# Patient Record
Sex: Male | Born: 1960 | Hispanic: No | Marital: Married | Smoking: Current every day smoker
Health system: Southern US, Community
[De-identification: ages and names within clinical notes are randomized; demographics above are authoritative.]

## PROBLEM LIST (undated history)

## (undated) DIAGNOSIS — Q279 Congenital malformation of peripheral vascular system, unspecified: Secondary | ICD-10-CM

## (undated) DIAGNOSIS — D519 Vitamin B12 deficiency anemia, unspecified: Secondary | ICD-10-CM

## (undated) DIAGNOSIS — E785 Hyperlipidemia, unspecified: Secondary | ICD-10-CM

## (undated) DIAGNOSIS — G4733 Obstructive sleep apnea (adult) (pediatric): Secondary | ICD-10-CM

## (undated) DIAGNOSIS — M47817 Spondylosis without myelopathy or radiculopathy, lumbosacral region: Secondary | ICD-10-CM

## (undated) DIAGNOSIS — M797 Fibromyalgia: Secondary | ICD-10-CM

## (undated) DIAGNOSIS — K649 Unspecified hemorrhoids: Secondary | ICD-10-CM

## (undated) DIAGNOSIS — K219 Gastro-esophageal reflux disease without esophagitis: Secondary | ICD-10-CM

## (undated) DIAGNOSIS — D369 Benign neoplasm, unspecified site: Secondary | ICD-10-CM

## (undated) HISTORY — DX: Congenital malformation of peripheral vascular system, unspecified: Q27.9

## (undated) HISTORY — PX: KNEE SURGERY: SHX244

## (undated) HISTORY — DX: Fibromyalgia: M79.7

## (undated) HISTORY — DX: Unspecified hemorrhoids: K64.9

## (undated) HISTORY — DX: Gastro-esophageal reflux disease without esophagitis: K21.9

## (undated) HISTORY — DX: Spondylosis without myelopathy or radiculopathy, lumbosacral region: M47.817

## (undated) HISTORY — DX: Vitamin B12 deficiency anemia, unspecified: D51.9

## (undated) HISTORY — DX: Obstructive sleep apnea (adult) (pediatric): G47.33

## (undated) HISTORY — DX: Benign neoplasm, unspecified site: D36.9

## (undated) HISTORY — DX: Hyperlipidemia, unspecified: E78.5

---

## 2018-12-14 HISTORY — PX: COLONOSCOPY: SHX174

## 2020-12-12 ENCOUNTER — Telehealth: Payer: Self-pay

## 2020-12-12 NOTE — Telephone Encounter (Signed)
Left message to call back.  Received records (colon report) from [facility] permanent. Need to know if pt wants to transfer care. Missing path results.

## 2020-12-19 NOTE — Telephone Encounter (Signed)
Spoke with patient. Is wanting to transfer care. Will get path results sent as well. Any provider is fine.

## 2021-01-11 NOTE — Telephone Encounter (Signed)
Patient brought in additional records from previous GI. Records given to [facility] to give to schedulers.

## 2021-01-18 ENCOUNTER — Encounter: Payer: Self-pay | Admitting: Gastroenterology

## 2021-02-08 ENCOUNTER — Other Ambulatory Visit (INDEPENDENT_AMBULATORY_CARE_PROVIDER_SITE_OTHER): Payer: 59

## 2021-02-08 ENCOUNTER — Encounter: Payer: Self-pay | Admitting: Gastroenterology

## 2021-02-08 ENCOUNTER — Ambulatory Visit (INDEPENDENT_AMBULATORY_CARE_PROVIDER_SITE_OTHER): Payer: 59 | Admitting: Gastroenterology

## 2021-02-08 VITALS — BP 122/68 | HR 76 | Ht 74.0 in | Wt 203.0 lb

## 2021-02-08 DIAGNOSIS — K649 Unspecified hemorrhoids: Secondary | ICD-10-CM | POA: Diagnosis not present

## 2021-02-08 DIAGNOSIS — K640 First degree hemorrhoids: Secondary | ICD-10-CM | POA: Diagnosis not present

## 2021-02-08 DIAGNOSIS — Z860100 Personal history of colon polyps, unspecified: Secondary | ICD-10-CM

## 2021-02-08 LAB — COMPREHENSIVE METABOLIC PANEL
ALT: 13 U/L (ref 0–53)
AST: 13 U/L (ref 0–37)
Albumin: 3.9 g/dL (ref 3.5–5.2)
Alkaline Phosphatase: 62 U/L (ref 39–117)
BUN: 13 mg/dL (ref 6–23)
CO2: 27 mEq/L (ref 19–32)
Calcium: 8.8 mg/dL (ref 8.4–10.5)
Chloride: 102 mEq/L (ref 96–112)
Creatinine, Ser: 1 mg/dL (ref 0.40–1.50)
GFR: 82.28 mL/min (ref >60.00)
Glucose, Bld: 92 mg/dL (ref 70–99)
Potassium: 4.2 mEq/L (ref 3.5–5.1)
Sodium: 136 mEq/L (ref 135–145)
Total Bilirubin: 0.5 mg/dL (ref 0.2–1.2)
Total Protein: 7.1 g/dL (ref 6.0–8.3)

## 2021-02-08 LAB — CBC WITH DIFFERENTIAL/PLATELET
Basophils Absolute: 0 10*3/uL (ref 0.0–0.1)
Basophils Relative: 0.2 % (ref 0.0–3.0)
Eosinophils Absolute: 0.3 10*3/uL (ref 0.0–0.7)
Eosinophils Relative: 4.6 % (ref 0.0–5.0)
HCT: 44.2 % (ref 39.0–52.0)
Hemoglobin: 15.2 g/dL (ref 13.0–17.0)
Lymphocytes Relative: 25.3 % (ref 12.0–46.0)
Lymphs Abs: 1.8 10*3/uL (ref 0.7–4.0)
MCHC: 34.4 g/dL (ref 30.0–36.0)
MCV: 98.3 fl (ref 78.0–100.0)
Monocytes Absolute: 0.5 10*3/uL (ref 0.1–1.0)
Monocytes Relative: 7.6 % (ref 3.0–12.0)
Neutro Abs: 4.4 10*3/uL (ref 1.4–7.7)
Neutrophils Relative %: 62.3 % (ref 43.0–77.0)
Platelets: 139 10*3/uL — ABNORMAL LOW (ref 150.0–400.0)
RBC: 4.49 Mil/uL (ref 4.22–5.81)
RDW: 12.7 % (ref 11.5–15.5)
WBC: 7 10*3/uL (ref 4.0–10.5)

## 2021-02-08 NOTE — Progress Notes (Signed)
HPI : Daryl Durham is a very pleasant 60 year old male originally from [place] who recently moved here from [place].  He has a history of colon polyps he has been having intermittent bright red blood per rectum.  He has some of the records from his previous GI evaluation which indicate he had a colonoscopy in July 2020 with 6 small tubular adenomas were found and removed and he was recommended to repeat colonoscopy in 3 years.  These notes also reference a colonoscopy in 2019 in which 2 polyps were removed including one that was piecemeal.  Further details of this polyp were not available including size and histology.  The colonoscopy from 2020 does not make mention of a tattoo or a polypectomy site. The patient states that he has been having bright red blood per rectum intermittently for many years.  He states that he will see blood for a few days in a row, then we will not see any blood for weeks or sometimes months.  He describes the blood as bright red and noted on the toilet paper and also in the toilet water, sometimes turning the water red.  When asked, he does admit that he is more likely to see blood when he has a hard stool or is straining with bowel movements.  He denies pain with the passage of stool.  He denies other GI symptoms such as abdominal pain, diarrhea, nausea or vomiting.  His weight has been stable. The patient asked if we can do lab test to check his kidney and liver function.   Past Medical History:  Diagnosis Date   Fibromyalgia    GERD (gastroesophageal reflux disease)    Hemorrhoids    Hyperlipidemia    Lumbosacral spondylosis    OSA (obstructive sleep apnea)    not on CPAP   Tubular adenoma    Venous malformation    Vitamin B12 deficiency anemia      Past Surgical History:  Procedure Laterality Date   COLONOSCOPY  12/14/2018   KNEE SURGERY     Family History  Problem Relation Age of Onset   Diabetes Mother    Social History   Tobacco Use    Smoking status: Every Day    Types: Cigarettes   Smokeless tobacco: Never  Vaping Use   Vaping Use: Never used  Substance Use Topics   Alcohol use: Yes   Drug use: Never  Patient states he will have 3 beers or glasses of wine every 2 weeks.  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   No current outpatient medications on file.   No current facility-administered medications for this visit.   No Known Allergies   Review of Systems: All systems reviewed and negative except where noted in HPI.    No results found.  Physical Exam: BP 122/68   Pulse 76   Ht '6\' 2"'$  (1.88 m)   Wt 203 lb (92.1 kg)   BMI 26.06 kg/m  Constitutional: Pleasant,well-developed, African male in no acute distress. HEENT: Normocephalic and atraumatic. Conjunctivae are normal. No scleral icterus.  Mallampati 2 Cardiovascular: Normal rate, regular rhythm.  Pulmonary/chest: Effort normal and breath sounds normal. No wheezing, rales or rhonchi. Abdominal: Soft, nondistended, nontender. Bowel sounds active throughout. There are no masses palpable. No hepatomegaly. Extremities: no edema Neurological: Alert and oriented to person place and time. Skin: Skin is warm and dry. No rashes noted. Psychiatric: Normal mood and affect. Behavior is normal.  CBC No results found for: WBC, RBC, HGB, HCT, PLT, MCV, MCH, MCHC, RDW, LYMPHSABS, MONOABS, EOSABS, BASOSABS  CMP  No results found for: NA, K, CL, CO2, GLUCOSE, BUN, CREATININE, CALCIUM, PROT, ALBUMIN, AST, ALT, ALKPHOS, BILITOT, GFRNONAA, GFRAA   Colonoscopy December 14, 2018 [facility] 6 tubular adenomas, all less than 10 mm, fair bowel prep in the right side Colonoscopy 2019, 2 tubular adenomas, 1 removed piecemeal  CBC December 2019, hemoglobin 15, platelets 182 liver enzymes and BMP unremarkable 2019   ASSESSMENT AND PLAN: 60 year old male history of polyps including a piecemeal polypectomy in  2019, with most recent colonoscopy in July 2020 with 6 small polyps, recommended to repeat in 3 years, also with longstanding history of intermittent bright red blood per rectum.  The intermittent hematochezia is very consistent with bleeding from internal hemorrhoids.  We discussed the anatomy and physiology of hemorrhoids and the principles behind hemorrhoid management to include optimization of his bowel habits and stool consistency.  I recommended that he start taking Metamucil on a daily basis.  Hemorrhoid banding was also discussed as an option.  The patient currently was not interested in banding at this time. Regarding his colonoscopy, he will be due in July of next year.  We will send him a reminder around that time.   Internal hemorrhoids, grade 1  -Daily Metamucil,  -Consider banding if still having bothersome symptoms despite Metamucil  History of polyps - Surveillance colonoscopy to be done in July 2023   Seydina Holliman E. Candis Schatz, MD [facility]     No ref. provider found

## 2021-02-08 NOTE — Patient Instructions (Signed)
If you are age 59 or older, your body mass index should be between 23-30. Your Body mass index is 26.06 kg/m. If this is out of the aforementioned range listed, please consider follow up with your Primary Care Provider.  If you are age 66 or younger, your body mass index should be between 19-25. Your Body mass index is 26.06 kg/m. If this is out of the aformentioned range listed, please consider follow up with your Primary Care Provider.   Your provider has requested that you go to the basement level for lab work before leaving today. Press "B" on the elevator. The lab is located at the first door on the left as you exit the elevator.  Please purchase Metamucil over the counter. Take as directed.  The [facility] providers would like to encourage you to use [facility] to communicate with providers for non-urgent requests or questions.  Due to long hold times on the telephone, sending your provider a message by [facility] may be a faster and more efficient way to get a response.  Please allow 48 business hours for a response.  Please remember that this is for non-urgent requests.   It was a pleasure to see you today!  Thank you for trusting me with your gastrointestinal care!    Scott E. Candis Schatz , MD

## 2021-02-12 ENCOUNTER — Other Ambulatory Visit: Payer: Self-pay

## 2021-02-12 DIAGNOSIS — D696 Thrombocytopenia, unspecified: Secondary | ICD-10-CM

## 2021-02-12 NOTE — Progress Notes (Signed)
Sheri, please inform Daryl Durham that his blood tests looked good.  His platelet count was slightly below the normal level.  I would recommend repeating this in 6 months to see if it is getting any lower.

## 2021-08-16 ENCOUNTER — Ambulatory Visit: Payer: Self-pay | Admitting: Physical Therapy

## 2021-09-02 ENCOUNTER — Ambulatory Visit: Payer: Commercial Managed Care - HMO | Attending: Physician Assistant | Admitting: Physical Therapy

## 2021-09-19 ENCOUNTER — Ambulatory Visit: Payer: Commercial Managed Care - HMO

## 2021-09-20 ENCOUNTER — Ambulatory Visit: Payer: Commercial Managed Care - HMO | Admitting: Physical Therapy

## 2021-10-01 ENCOUNTER — Other Ambulatory Visit: Payer: Self-pay | Admitting: Family Medicine

## 2021-10-01 DIAGNOSIS — N50811 Right testicular pain: Secondary | ICD-10-CM

## 2021-10-08 ENCOUNTER — Other Ambulatory Visit: Payer: Self-pay

## 2021-10-08 ENCOUNTER — Ambulatory Visit
Admission: RE | Admit: 2021-10-08 | Discharge: 2021-10-08 | Disposition: A | Discharge status: Home / Self Care | Payer: Self-pay | Source: Ambulatory Visit | Attending: Family Medicine | Admitting: Family Medicine

## 2021-10-08 DIAGNOSIS — N50811 Right testicular pain: Secondary | ICD-10-CM

## 2021-10-10 ENCOUNTER — Other Ambulatory Visit: Payer: Self-pay

## 2023-04-09 IMAGING — US US SCROTUM W/ DOPPLER COMPLETE
1 series · 14 of 25 positions shown · non-contrast
Comparison: None Available.

CLINICAL DATA: Right testicular pain.

EXAM:
SCROTAL ULTRASOUND
DOPPLER ULTRASOUND OF THE TESTICLES
TECHNIQUE: Complete ultrasound examination of the testicles, epididymis, and
other scrotal structures was performed. Color and spectral Doppler
ultrasound were also utilized to evaluate blood flow to the
testicles.

[Series 1: us scrotum w/ doppler complete · 0.06mm/px · 45 acquisitions, 14 frames shown]
[im 1/45]
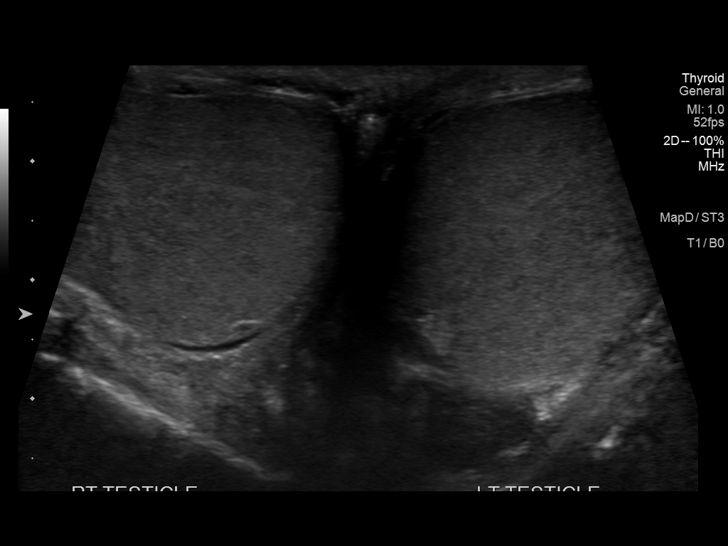
[im 4/45]
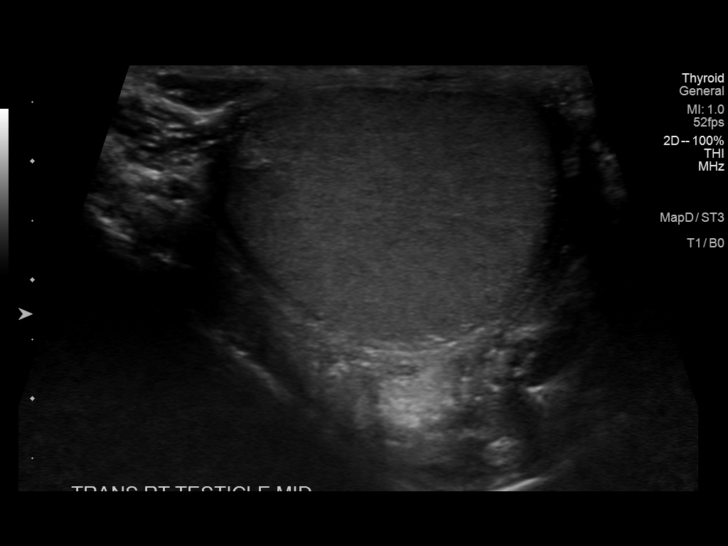
[im 8/45]
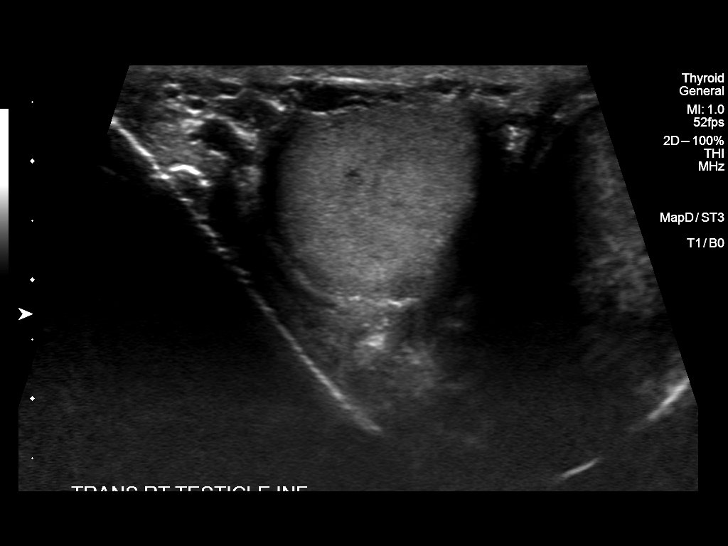
[im 12/45]
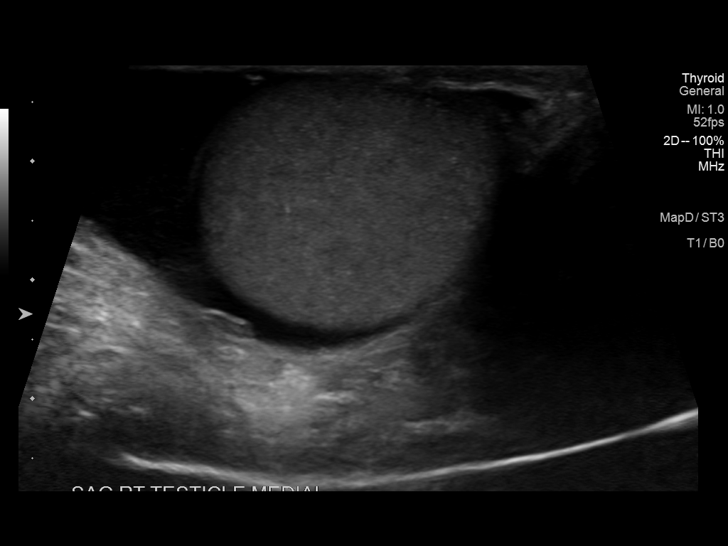
[im 15/45]
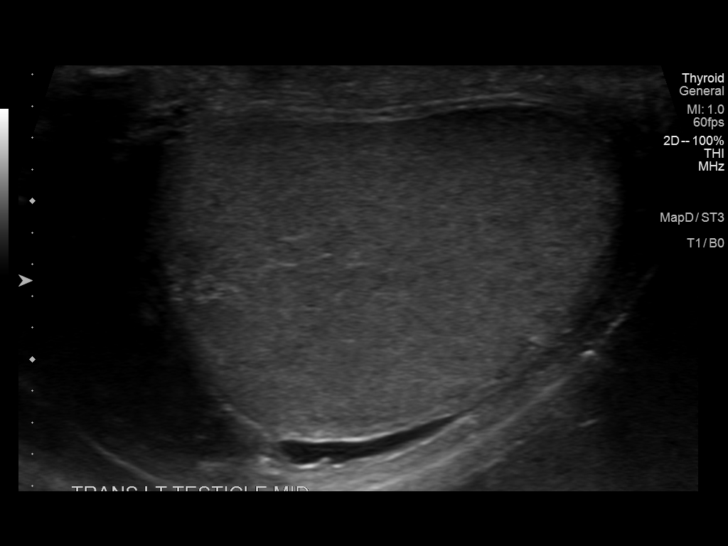
[im 17/45]
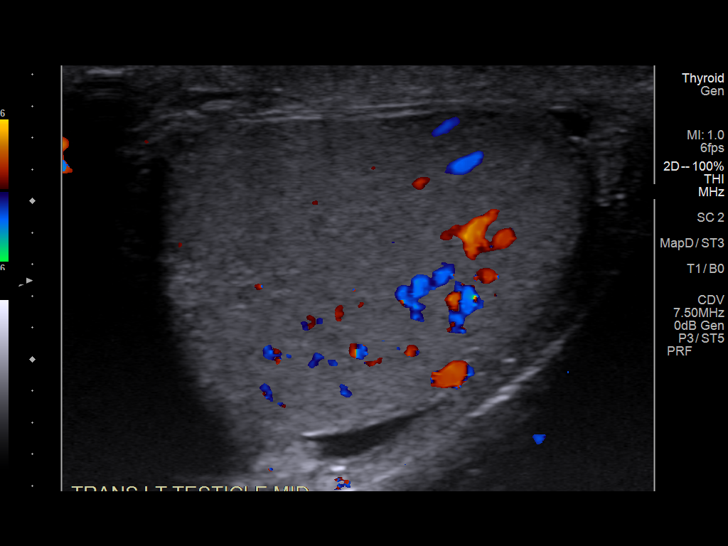
[im 21/45]
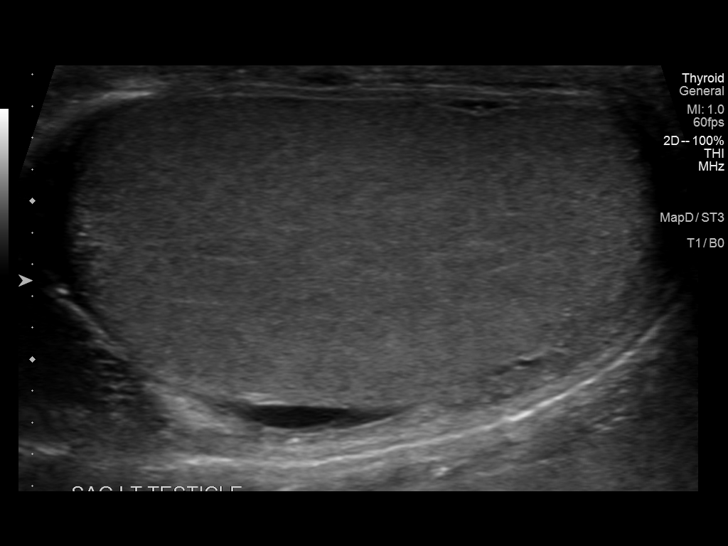
[im 24/45]
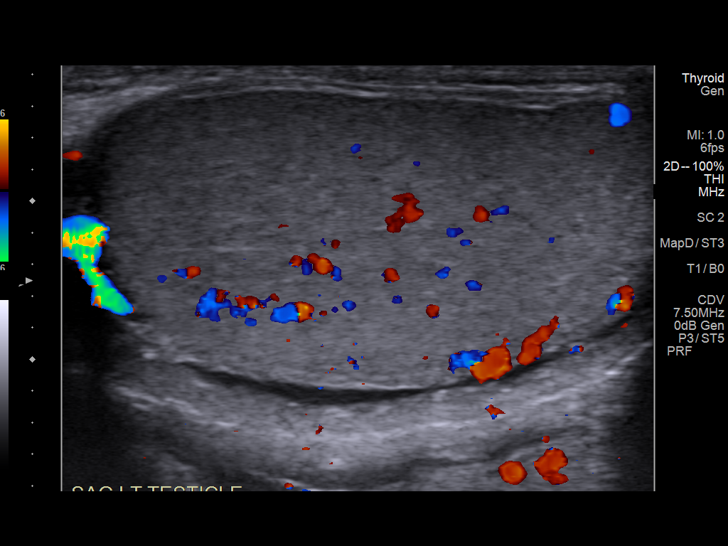
[im 28/45]
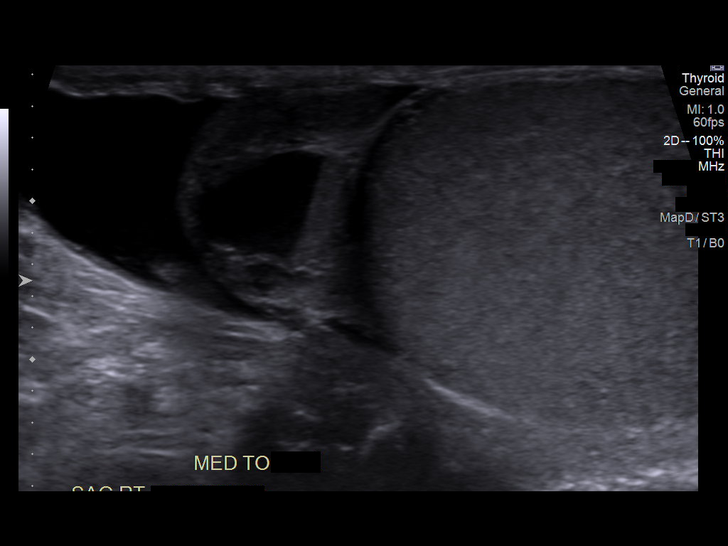
[im 30/45]
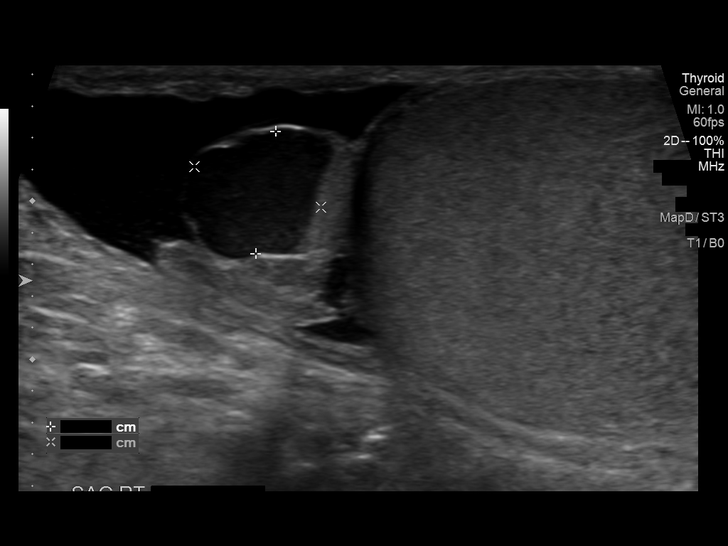
[im 34/45]
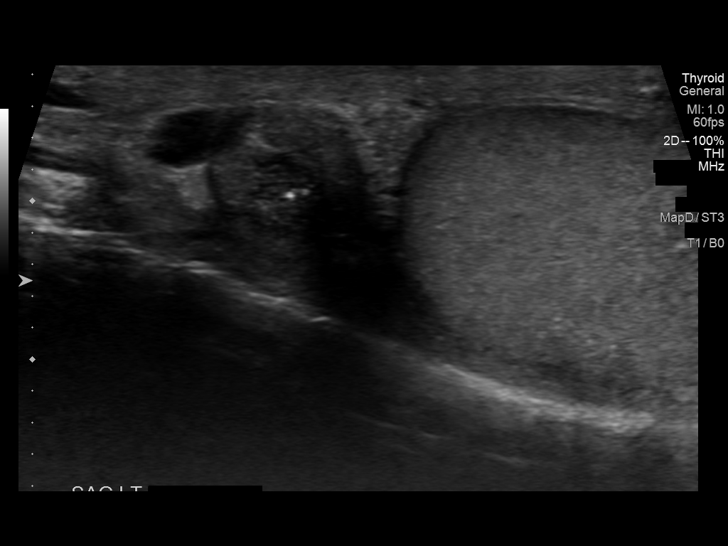
[im 37/45]
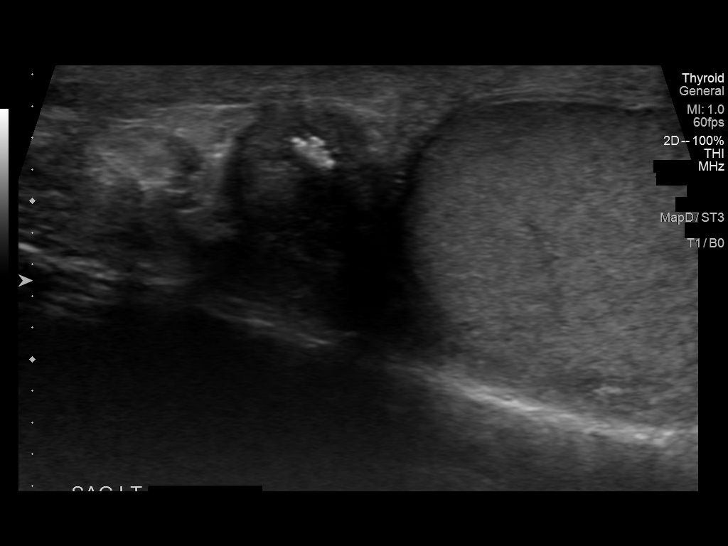
[im 41/45]
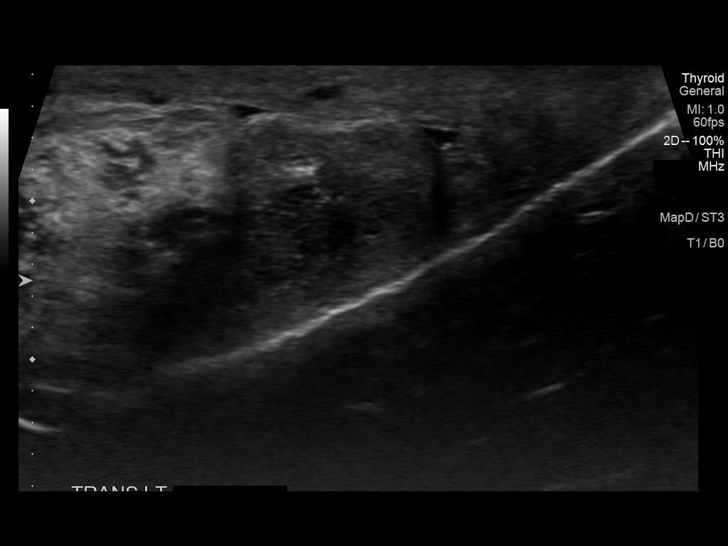
[im 45/45]
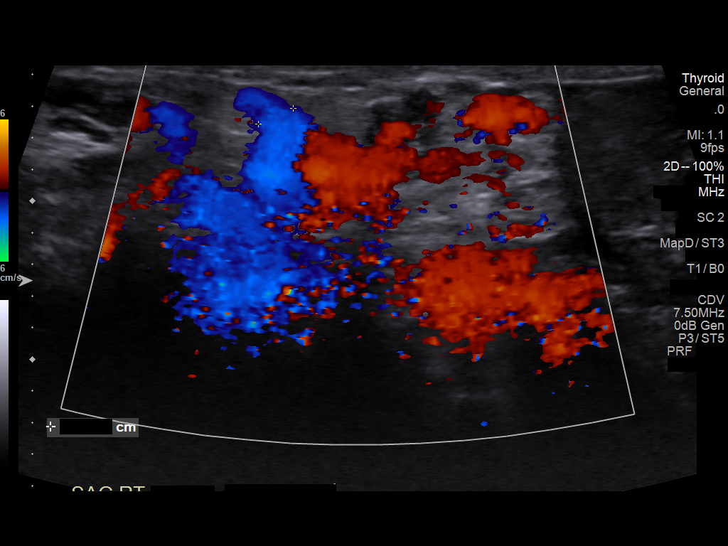

[14 of 25 positions shown; findings below may reference images not displayed]

FINDINGS: Right testicle

Measurements: 4.1 x 2.4 x 2.7 cm. No mass or microlithiasis
visualized.

Left testicle

Measurements: 3.8 x 2.0 x 2.9 cm. No mass or microlithiasis
visualized.

Right epididymis:  Small epididymal head cyst versus spermatocele.

Left epididymis: Normal in size and appearance. Small focal
calcification.

Hydrocele:  Small right-sided hydrocele.

Varicocele:  Small bilateral varicoceles.

Pulsed Doppler interrogation of both testes demonstrates normal low
resistance arterial and venous waveforms bilaterally.
IMPRESSION: No acute process.

Small right hydrocele and probable small bilateral varicoceles.

## 2024-01-01 ENCOUNTER — Ambulatory Visit: Admitting: Family
